# Patient Record
Sex: Female | Born: 1975 | Race: Black or African American | Hispanic: No | Marital: Married | Smoking: Never smoker
Health system: Southern US, Community
[De-identification: ages and names within clinical notes are randomized; demographics above are authoritative.]

## PROBLEM LIST (undated history)

## (undated) DIAGNOSIS — D219 Benign neoplasm of connective and other soft tissue, unspecified: Secondary | ICD-10-CM

## (undated) DIAGNOSIS — R350 Frequency of micturition: Secondary | ICD-10-CM

## (undated) HISTORY — PX: OTHER SURGICAL HISTORY: SHX169

---

## 1998-02-10 ENCOUNTER — Encounter: Admission: RE | Admit: 1998-02-10 | Discharge: 1998-05-11 | Payer: Self-pay | Admitting: *Deleted

## 1999-02-27 ENCOUNTER — Encounter: Admission: RE | Admit: 1999-02-27 | Discharge: 1999-03-23 | Payer: Self-pay

## 1999-03-27 ENCOUNTER — Other Ambulatory Visit: Admission: RE | Admit: 1999-03-27 | Discharge: 1999-03-27 | Payer: Self-pay | Admitting: Obstetrics & Gynecology

## 1999-11-15 ENCOUNTER — Inpatient Hospital Stay (HOSPITAL_COMMUNITY): Admission: AD | Admit: 1999-11-15 | Discharge: 1999-11-15 | Payer: Self-pay | Admitting: Obstetrics and Gynecology

## 1999-11-17 ENCOUNTER — Inpatient Hospital Stay (HOSPITAL_COMMUNITY): Admission: AD | Admit: 1999-11-17 | Discharge: 1999-11-19 | Payer: Self-pay | Admitting: Obstetrics and Gynecology

## 2000-03-28 ENCOUNTER — Encounter: Admission: RE | Admit: 2000-03-28 | Discharge: 2000-03-28 | Payer: Self-pay | Admitting: Family Medicine

## 2000-03-28 ENCOUNTER — Encounter: Payer: Self-pay | Admitting: Family Medicine

## 2000-06-20 ENCOUNTER — Other Ambulatory Visit: Admission: RE | Admit: 2000-06-20 | Discharge: 2000-06-20 | Payer: Self-pay | Admitting: Obstetrics and Gynecology

## 2000-08-01 ENCOUNTER — Encounter: Payer: Self-pay | Admitting: Orthopaedic Surgery

## 2000-08-01 ENCOUNTER — Ambulatory Visit (HOSPITAL_COMMUNITY): Admission: RE | Admit: 2000-08-01 | Discharge: 2000-08-01 | Payer: Self-pay | Admitting: Orthopaedic Surgery

## 2001-04-24 ENCOUNTER — Encounter: Admission: RE | Admit: 2001-04-24 | Discharge: 2001-04-24 | Payer: Self-pay | Admitting: Family Medicine

## 2001-04-24 ENCOUNTER — Encounter: Payer: Self-pay | Admitting: Family Medicine

## 2001-05-29 ENCOUNTER — Ambulatory Visit (HOSPITAL_COMMUNITY): Admission: RE | Admit: 2001-05-29 | Discharge: 2001-05-29 | Payer: Self-pay | Admitting: Family Medicine

## 2002-11-10 ENCOUNTER — Other Ambulatory Visit: Admission: RE | Admit: 2002-11-10 | Discharge: 2002-11-10 | Payer: Self-pay | Admitting: Obstetrics and Gynecology

## 2003-11-29 ENCOUNTER — Other Ambulatory Visit: Admission: RE | Admit: 2003-11-29 | Discharge: 2003-11-29 | Payer: Self-pay | Admitting: Obstetrics and Gynecology

## 2003-11-30 ENCOUNTER — Other Ambulatory Visit: Admission: RE | Admit: 2003-11-30 | Discharge: 2003-11-30 | Payer: Self-pay | Admitting: Obstetrics and Gynecology

## 2004-12-19 ENCOUNTER — Other Ambulatory Visit: Admission: RE | Admit: 2004-12-19 | Discharge: 2004-12-19 | Payer: Self-pay | Admitting: Obstetrics and Gynecology

## 2004-12-28 ENCOUNTER — Ambulatory Visit: Payer: Self-pay | Admitting: Internal Medicine

## 2005-01-04 ENCOUNTER — Ambulatory Visit: Payer: Self-pay

## 2005-01-12 ENCOUNTER — Ambulatory Visit: Payer: Self-pay

## 2008-08-08 ENCOUNTER — Inpatient Hospital Stay (HOSPITAL_COMMUNITY): Admission: AD | Admit: 2008-08-08 | Discharge: 2008-08-10 | Payer: Self-pay | Admitting: Obstetrics and Gynecology

## 2009-04-09 ENCOUNTER — Encounter
Admission: RE | Admit: 2009-04-09 | Discharge: 2009-04-09 | Payer: Self-pay | Admitting: Physical Medicine and Rehabilitation

## 2011-08-13 LAB — CBC
HCT: 23.3 — ABNORMAL LOW
HCT: 27.8 — ABNORMAL LOW
Hemoglobin: 7.7 — CL
Hemoglobin: 9.2 — ABNORMAL LOW
MCHC: 33.1
MCHC: 33.2
MCV: 81.5
MCV: 82.2
Platelets: 208
Platelets: 254
RBC: 2.83 — ABNORMAL LOW
RBC: 3.41 — ABNORMAL LOW
RDW: 16 — ABNORMAL HIGH
RDW: 16.3 — ABNORMAL HIGH
WBC: 10.1
WBC: 14.4 — ABNORMAL HIGH

## 2011-08-13 LAB — RPR: RPR Ser Ql: NONREACTIVE

## 2015-10-11 ENCOUNTER — Other Ambulatory Visit: Payer: Self-pay | Admitting: Obstetrics and Gynecology

## 2015-10-11 DIAGNOSIS — N6320 Unspecified lump in the left breast, unspecified quadrant: Secondary | ICD-10-CM

## 2015-10-14 ENCOUNTER — Ambulatory Visit
Admission: RE | Admit: 2015-10-14 | Discharge: 2015-10-14 | Disposition: A | Discharge status: Home / Self Care | Payer: 59 | Source: Ambulatory Visit | Attending: Obstetrics and Gynecology | Admitting: Obstetrics and Gynecology

## 2015-10-14 DIAGNOSIS — N6320 Unspecified lump in the left breast, unspecified quadrant: Secondary | ICD-10-CM

## 2017-06-26 ENCOUNTER — Other Ambulatory Visit: Payer: Self-pay | Admitting: Obstetrics and Gynecology

## 2017-06-26 DIAGNOSIS — N63 Unspecified lump in unspecified breast: Secondary | ICD-10-CM

## 2017-07-10 ENCOUNTER — Ambulatory Visit
Admission: RE | Admit: 2017-07-10 | Discharge: 2017-07-10 | Disposition: A | Discharge status: Home / Self Care | Payer: 59 | Source: Ambulatory Visit | Attending: Obstetrics and Gynecology | Admitting: Obstetrics and Gynecology

## 2017-07-10 DIAGNOSIS — N63 Unspecified lump in unspecified breast: Secondary | ICD-10-CM

## 2020-01-13 ENCOUNTER — Other Ambulatory Visit: Payer: Self-pay | Admitting: Obstetrics and Gynecology

## 2020-01-13 DIAGNOSIS — D259 Leiomyoma of uterus, unspecified: Secondary | ICD-10-CM

## 2020-02-20 ENCOUNTER — Other Ambulatory Visit: Payer: 59

## 2020-02-22 ENCOUNTER — Ambulatory Visit
Admission: RE | Admit: 2020-02-22 | Discharge: 2020-02-22 | Disposition: A | Discharge status: Home / Self Care | Payer: No Typology Code available for payment source | Source: Ambulatory Visit | Attending: Obstetrics and Gynecology | Admitting: Obstetrics and Gynecology

## 2020-02-22 ENCOUNTER — Other Ambulatory Visit: Payer: Self-pay

## 2020-02-22 DIAGNOSIS — D259 Leiomyoma of uterus, unspecified: Secondary | ICD-10-CM

## 2020-11-29 IMAGING — MR MR ABDOMEN W/O CM
7 series · 48 of 48 positions shown · non-contrast
Comparison: None

CLINICAL DATA: Evaluate adrenal glands.

EXAM:
MRI ABDOMEN WITHOUT CONTRAST
TECHNIQUE: Multiplanar multisequence MR imaging was performed without the
administration of intravenous contrast.

[Series 3: T2 · coronal · 5.0mm · 1.56mm/px · 2 of 36 slices shown (1 of 2)]
[im 1/36]
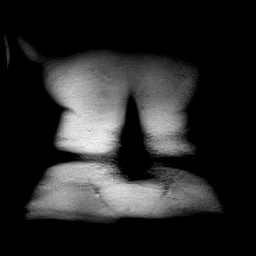
[im 36/36]
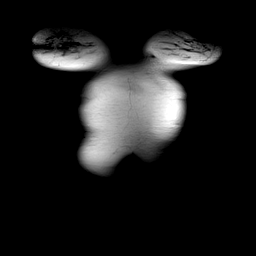

[Series 4: T2 · axial · 6.0mm · 1.19mm/px · z∈[-46,+163]mm · 3 of 30 slices shown (2 of 2)]
[im 1/30]
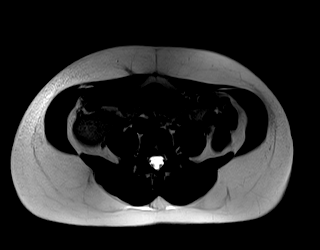
[im 15/30]
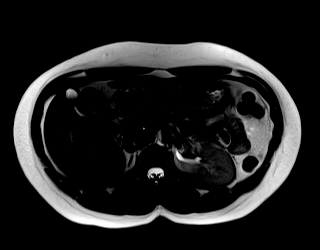
[im 30/30]
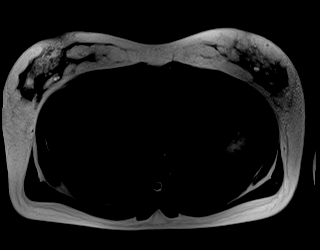

[Series 5: T1 · axial · 3.0mm · 1.48mm/px · z∈[-48,+165]mm · 14 of 144 slices shown]
[im 1/144]
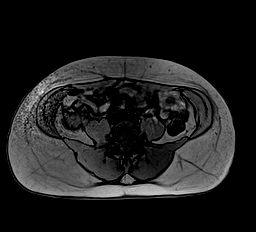
[im 12/144]
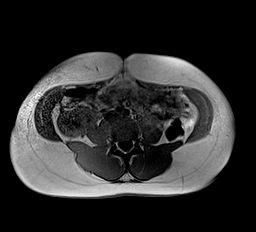
[im 23/144]
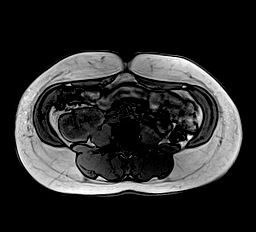
[im 34/144]
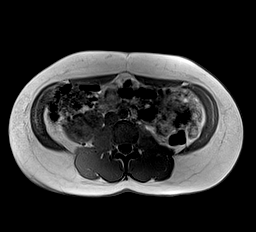
[im 45/144]
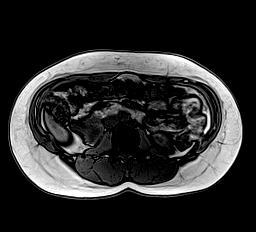
[im 56/144]
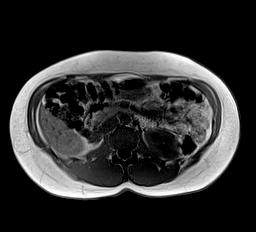
[im 67/144]
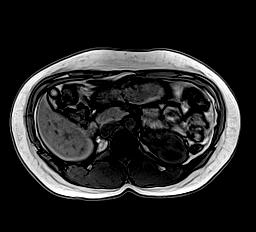
[im 78/144]
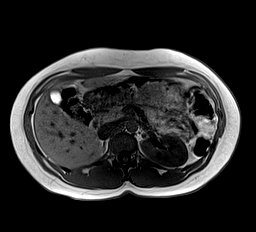
[im 89/144]
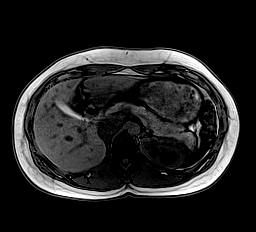
[im 100/144]
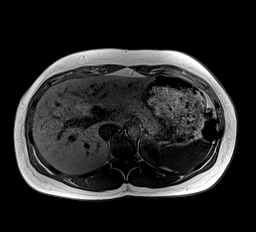
[im 111/144]
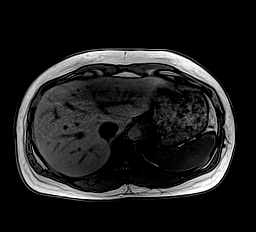
[im 122/144]
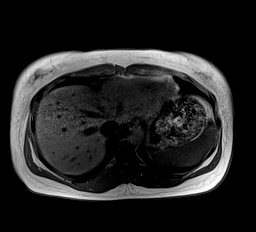
[im 133/144]
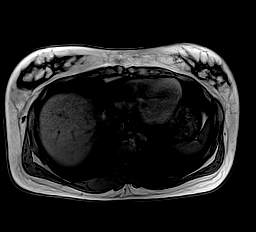
[im 144/144]
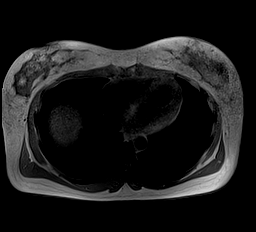

[Series 6: cor inout phase · coronal · 3.0mm · 1.48mm/px · 14 of 144 slices shown]
[im 1/144]
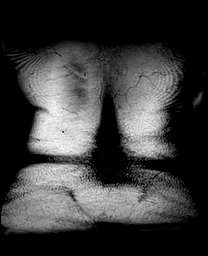
[im 12/144]
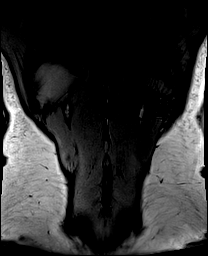
[im 23/144]
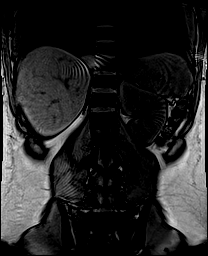
[im 34/144]
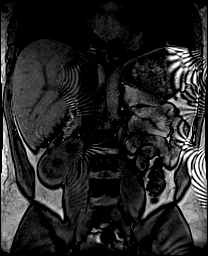
[im 45/144]
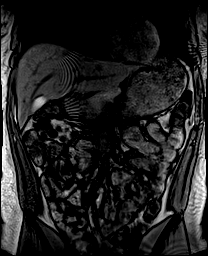
[im 56/144]
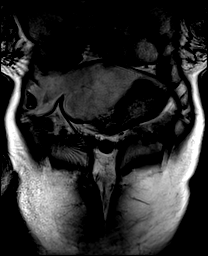
[im 67/144]
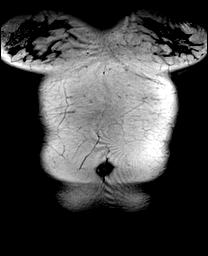
[im 78/144]
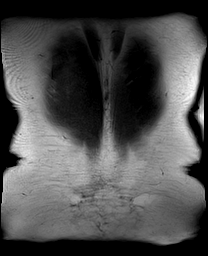
[im 89/144]
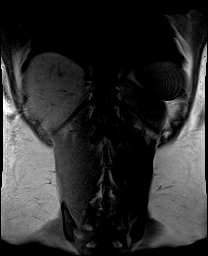
[im 100/144]
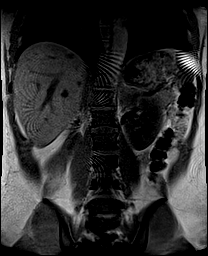
[im 111/144]
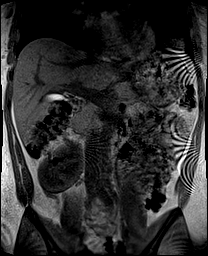
[im 122/144]
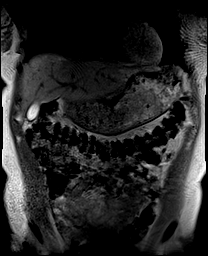
[im 133/144]
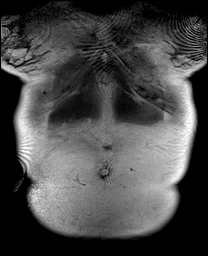
[im 144/144]
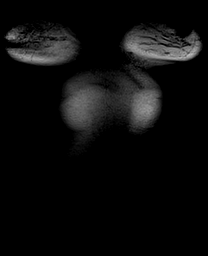

[Series 7: bSSFP · axial · 5.0mm · 1.56mm/px · z∈[-34,+151]mm · 4 of 38 slices shown (1 of 2)]
[im 1/38]
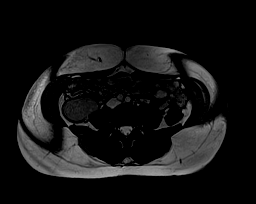
[im 13/38]
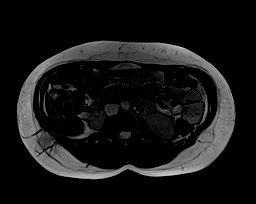
[im 25/38]
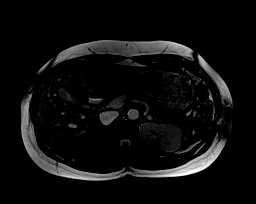
[im 38/38]
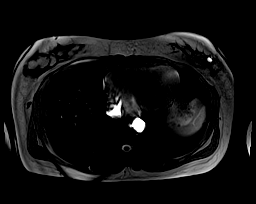

[Series 8: bSSFP · coronal · 5.0mm · 1.56mm/px · 4 of 38 slices shown (2 of 2)]
[im 1/38]
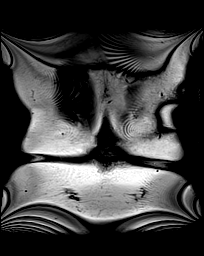
[im 13/38]
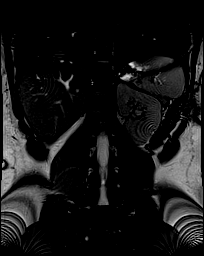
[im 25/38]
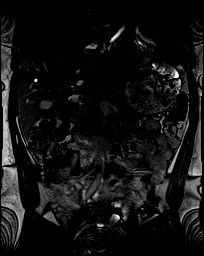
[im 38/38]
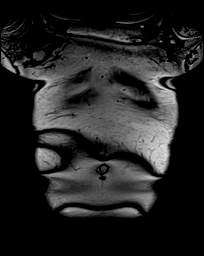

[Series 9: T1 dynamic · axial · 3.0mm · 1.56mm/px · z∈[-48,+165]mm · 7 of 72 slices shown]
[im 1/72]
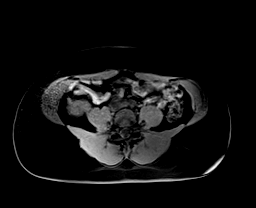
[im 12/72]
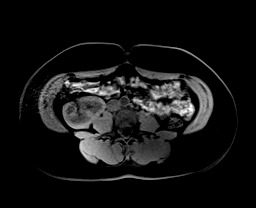
[im 24/72]
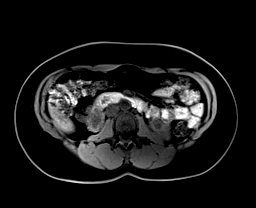
[im 36/72]
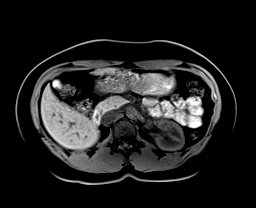
[im 48/72]
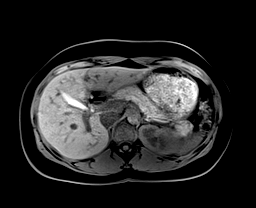
[im 60/72]
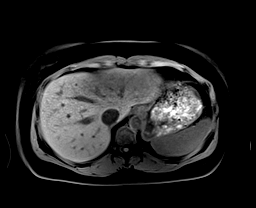
[im 72/72]
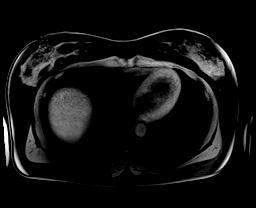

[48 of 48 positions shown; findings below may reference images not displayed]

FINDINGS: Lower chest: No acute findings.

Hepatobiliary: Within the subcapsular aspect of the lateral segment
of left lobe of liver there is a 8 mm T2 hyperintense and T1
hypointense structure. Technically too small to reliably
characterize but favored to represent a small cyst. No additional
focal liver abnormalities. Gallbladder is unremarkable. No biliary
ductal dilatation identified.

Pancreas: No mass, inflammatory changes, or other parenchymal
abnormality identified.

Spleen:  Within normal limits in size and appearance.

Adrenals/Urinary Tract: Normal adrenal glands. No nodule or mass
identified bilaterally. Left kidney normal. Small cystic lesion
within upper pole of right kidney measures 4 mm and is too small to
reliably characterize. No solid appearing mass identified within the
limitations of unenhanced technique. No hydronephrosis bilaterally.

Stomach/Bowel: Visualized portions within the abdomen are
unremarkable.

Vascular/Lymphatic: No pathologically enlarged lymph nodes
identified. No abdominal aortic aneurysm demonstrated.

Other:  None.

Musculoskeletal: No suspicious bone lesions identified.
IMPRESSION: 1. No adrenal nodule or mass identified .
2. Small right lobe of liver lesion is too small to reliably
characterize but favored to represent a small cyst.

## 2021-06-08 NOTE — Progress Notes (Signed)
Called pt to make lab appt and covid test appt to her surgery (robotic total hysterectomy) on 07-05-2021 by Dr Brien Mates. Pt stated she is out of town and would not be returning until 07-04-2021 day before surgery , flight scheduled to land 1615. Pt told she will need to arrive at [facility] 07-05-2021 pull up front the building and call [facility] front desk that she has arrive and nurse will come out to do rapid covid test then she receive call if test is negative she can come to check-in but  if test positive her procedure would be cancelled.  Pt verbalized understanding of this and that a nurse will be calling her closer to [facility] to completed pre-op interview and instructions , pt gave best contact # [phone]. Called and left message with OR scheduler for Dr Brien Mates to please inform doctor about this and any questions please call back.

## 2021-06-28 ENCOUNTER — Encounter (HOSPITAL_BASED_OUTPATIENT_CLINIC_OR_DEPARTMENT_OTHER): Payer: Self-pay | Admitting: Obstetrics and Gynecology

## 2021-06-28 ENCOUNTER — Other Ambulatory Visit: Payer: Self-pay

## 2021-06-28 DIAGNOSIS — Z973 Presence of spectacles and contact lenses: Secondary | ICD-10-CM

## 2021-06-28 DIAGNOSIS — R351 Nocturia: Secondary | ICD-10-CM

## 2021-06-28 DIAGNOSIS — D649 Anemia, unspecified: Secondary | ICD-10-CM

## 2021-06-28 DIAGNOSIS — G8929 Other chronic pain: Secondary | ICD-10-CM

## 2021-06-28 DIAGNOSIS — R519 Headache, unspecified: Secondary | ICD-10-CM

## 2021-06-28 DIAGNOSIS — I10 Essential (primary) hypertension: Secondary | ICD-10-CM

## 2021-06-28 DIAGNOSIS — M549 Dorsalgia, unspecified: Secondary | ICD-10-CM

## 2021-06-28 HISTORY — DX: Presence of spectacles and contact lenses: Z97.3

## 2021-06-28 HISTORY — DX: Essential (primary) hypertension: I10

## 2021-06-28 HISTORY — DX: Other chronic pain: G89.29

## 2021-06-28 HISTORY — DX: Anemia, unspecified: D64.9

## 2021-06-28 HISTORY — DX: Dorsalgia, unspecified: M54.9

## 2021-06-28 HISTORY — DX: Headache, unspecified: R51.9

## 2021-06-28 HISTORY — DX: Nocturia: R35.1

## 2021-06-28 NOTE — Progress Notes (Addendum)
Your procedure is scheduled on 07-05-2021   Report to [facility] FOR YOUR RAPID COVID TEST PULL UP TO THE FRONT OF THE BUILDING AND CALL [phone] AND TELL THE FRONT DESK YOU HAVE ARRIVE AND REMAIN IN YOUR CAR FOR THE COVID TEST AND YOU WILL BE CALLED TO COME IN FOR YOUR SURGERY IF THE COVID TEST IS NEGATIVE.IF THE COVID TEST IS POSITIVE YOUR SURGERY WILL NEED TO BE CANCELLED.   Call this number if you have problems the morning of surgery  :[phone].   OUR ADDRESS IS [place].  WE ARE LOCATED IN [facility].  PLEASE BRING YOUR INSURANCE CARD AND PHOTO ID DAY OF SURGERY.  ONLY ONE PERSON ALLOWED IN FACILITY WAITING AREA.                                     REMEMBER:  DO NOT EAT FOOD, CANDY GUM OR MINTS  AFTER MIDNIGHT . YOU MAY HAVE CLEAR LIQUIDS FROM MIDNIGHT UNTIL 1000 AM. NO CLEAR LIQUIDS AFTER 1000 AM DAY OF SURGERY.   YOU MAY  BRUSH YOUR TEETH MORNING OF SURGERY AND RINSE YOUR MOUTH OUT, NO CHEWING GUM CANDY OR MINTS.    CLEAR LIQUID DIET   Foods Allowed                                                                     Foods Excluded  Coffee and tea, regular and decaf                             liquids that you cannot  Plain Jell-O any favor except red or purple                                           see through such as: Fruit ices (not with fruit pulp)                                     milk, soups, orange juice  Iced Popsicles                                    All solid food Carbonated beverages, regular and diet                                    Cranberry, grape and apple juices Sports drinks like Gatorade Lightly seasoned clear broth or consume(fat free) Sugar, honey syrup  Sample Menu Breakfast                                Lunch  Supper Cranberry juice                    Beef broth                            Chicken broth Jell-O                                      Grape juice                           Apple juice Coffee or tea                        Jell-O                                      Popsicle                                                Coffee or tea                        Coffee or tea  _____________________________________________________________________     TAKE THESE MEDICATIONS MORNING OF SURGERY WITH A SIP OF WATER:  AMLODIPINE  ONE VISITOR IS ALLOWED IN WAITING ROOM ONLY DAY OF SURGERY.  NO VISITOR MAY SPEND THE NIGHT.  VISITOR ARE ALLOWED TO STAY UNTIL 800 PM.                                    DO NOT WEAR JEWERLY, MAKE UP. DO NOT WEAR LOTIONS, POWDERS, PERFUMES OR NAIL POLISH. DO NOT SHAVE FOR 48 HOURS PRIOR TO DAY OF SURGERY. MEN MAY SHAVE FACE AND NECK. CONTACTS, GLASSES, OR DENTURES MAY NOT BE WORN TO SURGERY.                                    [name] IS NOT RESPONSIBLE  FOR ANY BELONGINGS.                                                                    [name]           [name] - Preparing for Surgery (BUY THE SURGICAL SOAP AT [facility] A DRUGSTORE IT MAY BE ANY BRAND SUCH AS HIBICLENS, CHLOROHEXADINE, OR ANY PHARMACY STAFF FOR ANY SIMILAR SURGICAL SOAP)   Before surgery, you can play an important role.  Because skin is not sterile, your skin needs to be as free of germs as possible.  You can reduce the number of germs on your skin by washing with CHG (chlorahexidine gluconate) soap before surgery.  CHG is an antiseptic cleaner which kills germs and bonds with  the skin to continue killing germs even after washing. Please DO NOT use if you have an allergy to CHG or antibacterial soaps.  If your skin becomes reddened/irritated stop using the CHG and inform your nurse when you arrive at Short Stay. Do not shave (including legs and underarms) for at least 48 hours prior to the first CHG shower.  You may shave your face/neck. Please follow these instructions carefully:  1.  Shower with CHG Soap the night before  surgery and the  morning of Surgery.  2.  If you choose to wash your hair, wash your hair first as usual with your  normal  shampoo.  3.  After you shampoo, rinse your hair and body thoroughly to remove the  shampoo.                            4.  Use CHG as you would any other liquid soap.  You can apply chg directly  to the skin and wash                      Gently with a scrungie or clean washcloth.  5.  Apply the CHG Soap to your body ONLY FROM THE NECK DOWN.   Do not use on face/ open                           Wound or open sores. Avoid contact with eyes, ears mouth and genitals (private parts).                       Wash face,  Genitals (private parts) with your normal soap.             6.  Wash thoroughly, paying special attention to the area where your surgery  will be performed.  7.  Thoroughly rinse your body with warm water from the neck down.  8.  DO NOT shower/wash with your normal soap after using and rinsing off  the CHG Soap.                9.  Pat yourself dry with a clean towel.            10.  Wear clean pajamas.            11.  Place clean sheets on your bed the night of your first shower and do not  sleep with pets. Day of Surgery : Do not apply any lotions/deodorants the morning of surgery.  Please wear clean clothes to the hospital/surgery center.  FAILURE TO FOLLOW THESE INSTRUCTIONS MAY RESULT IN THE CANCELLATION OF YOUR SURGERY PATIENT SIGNATURE_________________________________  NURSE SIGNATURE__________________________________  ________________________________________________________________________                                                        QUESTIONS Dawn Pitts PRE OP NURSE PHONE [phone].

## 2021-06-28 NOTE — Progress Notes (Signed)
Spoke w/ via phone for pre-op interview---pt Lab needs dos----   urine poct bmet, ekg per anesthesia, surgery orders pending (need cbc and t &s ) COVID test ----rapid covid test dos due to out of town until 07-04-2021 Arrive at -------745 am NPO after MN NO Solid Food.  Clear liquids from MN until---645 am Med rec completed Medications to take morning of surgery -----amlodipine Diabetic medication -----n/a Patient instructed no nail polish to be worn day of surgery Patient instructed to bring photo id and insurance card day of surgery Patient aware to have Driver (ride ) / caregiver  spouse Dawn Pitts   for 24 hours after surgery  Patient Special Instructions -----pt given overnight stay instructions Pre-Op special Istructions -----surg order req dr Brien Mates epcic ib Patient verbalized understanding of instructions that were given at this phone interview. Patient denies shortness of breath, chest pain, fever, cough at this phone interview.

## 2021-06-29 NOTE — Progress Notes (Signed)
Your procedure is scheduled on 07-05-2021   Report to [facility] FOR YOUR RAPID COVID TEST PULL UP TO THE FRONT OF THE BUILDING AND CALL [phone] AND TELL THE FRONT DESK YOU HAVE ARRIVE AND REMAIN IN YOUR CAR FOR THE COVID TEST AND YOU WILL BE CALLED TO COME IN FOR YOUR SURGERY IF THE COVID TEST IS NEGATIVE.IF THE COVID TEST IS POSITIVE YOUR SURGERY WILL NEED TO BE CANCELLED.   Call this number if you have problems the morning of surgery  :[phone].   OUR ADDRESS IS [place].  WE [facility]ATED [facility].  PLEASE BRING YOUR INSURANCE CARD AND PHOTO ID DAY OF SURGERY.  ONLY ONE PERSON ALLOWED IN FACILITY WAITING AREA.                                     REMEMBER:  DO NOT EAT FOOD, CANDY GUM OR MINTS  AFTER MIDNIGHT . YOU MAY HAVE CLEAR LIQUIDS FROM MIDNIGHT UNTIL 1000 AM. NO CLEAR LIQUIDS AFTER 1000 AM DAY OF SURGERY.   YOU MAY  BRUSH YOUR TEETH MORNING OF SURGERY AND RINSE YOUR MOUTH OUT, NO CHEWING GUM CANDY OR MINTS.    CLEAR LIQUID DIET   Foods Allowed                                                                     Foods Excluded  Coffee and tea, regular and decaf                             liquids that you cannot  Plain Jell-O any favor except red or purple                                           see through such as: Fruit ices (not with fruit pulp)                                     milk, soups, orange juice  Iced Popsicles                                    All solid food Carbonated beverages, regular and diet                                    Cranberry, grape and apple juices Sports drinks like Gatorade Lightly seasoned clear broth or consume(fat free) Sugar, honey syrup  Sample Menu Breakfast                                Lunch  Supper Cranberry juice                    Beef broth                            Chicken broth Jell-O                                      Grape juice                           Apple juice Coffee or tea                        Jell-O                                      Popsicle                                                Coffee or tea                        Coffee or tea  _____________________________________________________________________     TAKE THESE MEDICATIONS MORNING OF SURGERY WITH A SIP OF WATER:  AMLODIPINE  ONE VISITOR IS ALLOWED IN WAITING ROOM ONLY DAY OF SURGERY.  NO VISITOR MAY SPEND THE NIGHT.  VISITOR ARE ALLOWED TO STAY UNTIL 800 PM.                                    DO NOT WEAR JEWERLY, MAKE UP. DO NOT WEAR LOTIONS, POWDERS, PERFUMES OR NAIL POLISH. DO NOT SHAVE FOR 48 HOURS PRIOR TO DAY OF SURGERY. MEN MAY SHAVE FACE AND NECK. CONTACTS, GLASSES, OR DENTURES MAY NOT BE WORN TO SURGERY.                                    [facility] IS NOT RESPONSIBLE  FOR ANY BELONGINGS.                                                                    [name]           [place] - Preparing for Surgery (BUY THE SURGICAL SOAP AT [facility] DRUG[facility]ORE IT MAY BE ANY BRAND SUCH AS HIBICLENS, CHLOROHEXADINE, OR ANY PHARMACY STAFF FOR ANY SIMILAR SURGICAL SOAP)   Before surgery, you can play an important role.  Because skin is not sterile, your skin needs to be as free of germs as possible.  You can reduce the number of germs on your skin by washing with CHG (chlorahexidine gluconate) soap before surgery.  CHG is an antiseptic cleaner which kills germs and bonds with  the skin to continue killing germs even after washing. Please DO NOT use if you have an allergy to CHG or antibacterial soaps.  If your skin becomes reddened/irritated stop using the CHG and inform your nurse when you arrive at Short Stay. Do not shave (including legs and underarms) for at least 48 hours prior to the first CHG shower.  You may shave your face/neck. Please follow these instructions carefully:  1.  Shower with CHG Soap the night before  surgery and the  morning of Surgery.  2.  If you choose to wash your hair, wash your hair first as usual with your  normal  shampoo.  3.  After you shampoo, rinse your hair and body thoroughly to remove the  shampoo.                            4.  Use CHG as you would any other liquid soap.  You can apply chg directly  to the skin and wash                      Gently with a scrungie or clean washcloth.  5.  Apply the CHG Soap to your body ONLY FROM THE NECK DOWN.   Do not use on face/ open                           Wound or open sores. Avoid contact with eyes, ears mouth and genitals (private parts).                       Wash face,  Genitals (private parts) with your normal soap.             6.  Wash thoroughly, paying special attention to the area where your surgery  will be performed.  7.  Thoroughly rinse your body with warm water from the neck down.  8.  DO NOT shower/wash with your normal soap after using and rinsing off  the CHG Soap.                9.  Pat yourself dry with a clean towel.            10.  Wear clean pajamas.            11.  Place clean sheets on your bed the night of your first shower and do not  sleep with pets. Day of Surgery : Do not apply any lotions/deodorants the morning of surgery.  Please wear clean clothes to the hospital/surgery center.  FAILURE TO FOLLOW THESE INSTRUCTIONS MAY RESULT IN THE CANCELLATION OF YOUR SURGERY PATIENT SIGNATURE_________________________________  NURSE SIGNATURE__________________________________  ________________________________________________________________________                                                        QUESTIONS Dawn Pitts PRE OP NURSE PHONE [phone].

## 2021-07-04 NOTE — H&P (Signed)
Dawn Pitts is an 45 y.o. female P4 with symptomatic multifibroid uterus, failed medical management, desires hysterectomy for definitive management.   Previously patient of Dr. Ouida Sills, was in medication trial previously with progesterone receptor modulator, helped very little with symptoms. Had consult for hysterectomy with Dr. Ouida Sills last year, was unable to move forward due to lack of coverage at work. States she is ready to move forward now and symptoms have become worse.   "Heavy 5 day periods for past several years, soaks through maxi pad on days 1-3. Now also spotting in between periods.  Also bothered by constant pelvic and rectal pressure. Pressure getting worse.   States BP normal at PCP last month.   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  Allergies: none Meds: Phentermine Surgeries: right knee and achilles surgeries OB h/o SVD x 4"  04/25/21 [place]: retroverted uterus 8.45cm, multiple fibroids intramural and submucosal (2 largest at right uterine fundus, 4cm and 2.5cm), simple cyst on right ovary, normal left ovary   Since last visit, patient diagnosed with hypertension by PCP and started on amlodipine.   Patient's last menstrual period was 06/22/2021.    Past Medical History:  Diagnosis Date   Anemia 06/28/2021   Chronic back pain 06/28/2021   Fibroid    Headache 06/28/2021   Hypertension 06/28/2021   Nocturia 06/28/2021   Urinary frequency    Wears glasses 06/28/2021    Past Surgical History:  Procedure Laterality Date   right achilles tendon repair     7 yrs ago per pt on 06-28-2021   right acl repair     1993    History reviewed. No pertinent family history.  Social History:  reports that she has never smoked. She has never used smokeless tobacco. She reports current alcohol use. She reports that she does not use drugs.  Allergies: No Known Allergies  Medications Prior to Admission  Medication Sig Dispense Refill Last Dose   AMLODIPINE  BENZOATE PO Take 10 mg by mouth in the morning.   07/05/2021 at 0645   Ferrous Sulfate (IRON) 325 (65 Fe) MG TABS Take by mouth daily.   07/03/2021   MELOXICAM PO Take by mouth daily.   06/30/2021    Review of Systems  Constitutional:  Negative for fever.  HENT:  Negative for sore throat.   Eyes:  Negative for pain.  Respiratory:  Negative for shortness of breath.   Cardiovascular:  Negative for chest pain.  Gastrointestinal:  Negative for abdominal pain.  Genitourinary:  Negative for dysuria.  Musculoskeletal:  Negative for joint swelling.  Skin:  Negative for rash.  Neurological:  Negative for headaches.  Psychiatric/Behavioral:  Negative for suicidal ideas.    Blood pressure (!) 177/106, pulse 81, temperature 98.4 F (36.9 C), temperature source Oral, resp. rate 16, height '5\' 4"'$  (1.626 m), weight 78.6 kg, last menstrual period 06/22/2021, SpO2 100 %. Physical Exam  Chaperone: present    Abdomen Auscultation/Inspection/Palpation: normal bowel sounds, soft, non-distended, no tenderness, no hepatomegaly, no splenomegaly, no masses, no CVA tenderness Hernia: none palpated  Female Genitalia Vulva: no masses, no atrophy, no lesions Bladder/Urethra: normal meatus, no urethral discharge, no urethral mass, bladder non distended Vagina no tenderness, no erythema, no abnormal vaginal discharge, no vesicle(s) or ulcers, no cystocele, no rectocele Cervix: grossly normal, no discharge, no cervical motion tenderness Uterus: normal size, mobile, non-tender, no uterine prolapse, contour irregular, fibroids, retroverted Adnexa/Parametria: no parametrial tenderness, no parametrial mass, no adnexal tenderness, no ovarian mass  CVS: RRR lungs: CTAB  Results for orders placed or performed during the hospital encounter of 07/05/21 (from the past 24 hour(s))  Resp Panel by RT-PCR (Flu A&B, Covid) Nasopharyngeal Swab     Status: None   Collection Time: 07/05/21  7:58 AM   Specimen: Nasopharyngeal  Swab; Nasopharyngeal(NP) swabs in vial transport medium  Result Value Ref Range   SARS Coronavirus 2 by RT PCR NEGATIVE NEGATIVE   Influenza A by PCR NEGATIVE NEGATIVE   Influenza B by PCR NEGATIVE NEGATIVE  Pregnancy, urine POC     Status: None   Collection Time: 07/05/21  9:11 AM  Result Value Ref Range   Preg Test, Ur NEGATIVE NEGATIVE  Type and screen [facility]     Status: None (Preliminary result)   Collection Time: 07/05/21  9:57 AM  Result Value Ref Range   ABO/RH(D) PENDING    Antibody Screen PENDING    Sample Expiration      07/08/2021,2359 Performed at [facility] [address] [place], [place] [address]     No results found.  Assessment/Plan: 45Y P4 with multifibroid uterus,menorrhagia, failed medical management, here for exam under anesthesia, robot assisted total laparoscopic hysterectomy, bilateral salpingectomy, cystoscopy. - Risks/benefits of surgery reviewed. Discussed risk of infection, bleeding, damage to surrounding organs, laparotomy, blood transfusion. Patient states understanding and signed consent form.  - NPO - Ancef 2g - Foley   Rowland Lathe 07/05/2021, 10:40 AM

## 2021-07-05 ENCOUNTER — Ambulatory Visit (HOSPITAL_BASED_OUTPATIENT_CLINIC_OR_DEPARTMENT_OTHER): Payer: No Typology Code available for payment source | Admitting: Anesthesiology

## 2021-07-05 ENCOUNTER — Ambulatory Visit (HOSPITAL_BASED_OUTPATIENT_CLINIC_OR_DEPARTMENT_OTHER)
Admission: RE | Admit: 2021-07-05 | Discharge: 2021-07-05 | Disposition: A | Discharge status: Home / Self Care | Payer: No Typology Code available for payment source | Attending: Obstetrics and Gynecology | Admitting: Obstetrics and Gynecology

## 2021-07-05 ENCOUNTER — Other Ambulatory Visit: Payer: Self-pay

## 2021-07-05 ENCOUNTER — Encounter (HOSPITAL_BASED_OUTPATIENT_CLINIC_OR_DEPARTMENT_OTHER)
Admission: RE | Disposition: A | Discharge status: Home / Self Care | Payer: Self-pay | Source: Home / Self Care | Attending: Obstetrics and Gynecology

## 2021-07-05 ENCOUNTER — Encounter (HOSPITAL_BASED_OUTPATIENT_CLINIC_OR_DEPARTMENT_OTHER): Payer: Self-pay | Admitting: Obstetrics and Gynecology

## 2021-07-05 DIAGNOSIS — N800 Endometriosis of uterus: Secondary | ICD-10-CM | POA: Insufficient documentation

## 2021-07-05 DIAGNOSIS — Z791 Long term (current) use of non-steroidal anti-inflammatories (NSAID): Secondary | ICD-10-CM | POA: Diagnosis not present

## 2021-07-05 DIAGNOSIS — R102 Pelvic and perineal pain: Secondary | ICD-10-CM | POA: Diagnosis not present

## 2021-07-05 DIAGNOSIS — Z90710 Acquired absence of both cervix and uterus: Secondary | ICD-10-CM | POA: Diagnosis present

## 2021-07-05 DIAGNOSIS — Z20822 Contact with and (suspected) exposure to covid-19: Secondary | ICD-10-CM | POA: Diagnosis not present

## 2021-07-05 DIAGNOSIS — I10 Essential (primary) hypertension: Secondary | ICD-10-CM | POA: Insufficient documentation

## 2021-07-05 DIAGNOSIS — N920 Excessive and frequent menstruation with regular cycle: Secondary | ICD-10-CM | POA: Insufficient documentation

## 2021-07-05 DIAGNOSIS — Z79899 Other long term (current) drug therapy: Secondary | ICD-10-CM | POA: Insufficient documentation

## 2021-07-05 DIAGNOSIS — D259 Leiomyoma of uterus, unspecified: Secondary | ICD-10-CM | POA: Diagnosis not present

## 2021-07-05 HISTORY — DX: Frequency of micturition: R35.0

## 2021-07-05 HISTORY — PX: ROBOTIC ASSISTED TOTAL HYSTERECTOMY WITH BILATERAL SALPINGO OOPHERECTOMY: SHX6086

## 2021-07-05 HISTORY — PX: CYSTOSCOPY: SHX5120

## 2021-07-05 HISTORY — DX: Benign neoplasm of connective and other soft tissue, unspecified: D21.9

## 2021-07-05 LAB — BASIC METABOLIC PANEL
Anion gap: 9 (ref 5–15)
BUN: 12 mg/dL (ref 6–20)
CO2: 24 mmol/L (ref 22–32)
Calcium: 9.4 mg/dL (ref 8.9–10.3)
Chloride: 107 mmol/L (ref 98–111)
Creatinine, Ser: 0.8 mg/dL (ref 0.44–1.00)
GFR, Estimated: >60 mL/min (ref >60)
Glucose, Bld: 97 mg/dL (ref 70–99)
Potassium: 3.7 mmol/L (ref 3.5–5.1)
Sodium: 140 mmol/L (ref 135–145)

## 2021-07-05 LAB — CBC WITH DIFFERENTIAL/PLATELET
Abs Immature Granulocytes: 0.01 10*3/uL (ref 0.00–0.07)
Basophils Absolute: 0 10*3/uL (ref 0.0–0.1)
Basophils Relative: 1 %
Eosinophils Absolute: 0.1 10*3/uL (ref 0.0–0.5)
Eosinophils Relative: 2 %
HCT: 36.9 % (ref 36.0–46.0)
Hemoglobin: 11 g/dL — ABNORMAL LOW (ref 12.0–15.0)
Immature Granulocytes: 0 %
Lymphocytes Relative: 32 %
Lymphs Abs: 1.7 10*3/uL (ref 0.7–4.0)
MCH: 22.7 pg — ABNORMAL LOW (ref 26.0–34.0)
MCHC: 29.8 g/dL — ABNORMAL LOW (ref 30.0–36.0)
MCV: 76.1 fL — ABNORMAL LOW (ref 80.0–100.0)
Monocytes Absolute: 0.4 10*3/uL (ref 0.1–1.0)
Monocytes Relative: 8 %
Neutro Abs: 3.1 10*3/uL (ref 1.7–7.7)
Neutrophils Relative %: 57 %
Platelets: 364 10*3/uL (ref 150–400)
RBC: 4.85 MIL/uL (ref 3.87–5.11)
RDW: 26 % — ABNORMAL HIGH (ref 11.5–15.5)
WBC: 5.4 10*3/uL (ref 4.0–10.5)
nRBC: 0 % (ref 0.0–0.2)

## 2021-07-05 LAB — RESP PANEL BY RT-PCR (FLU A&B, COVID) ARPGX2
Influenza A by PCR: NEGATIVE
Influenza B by PCR: NEGATIVE
SARS Coronavirus 2 by RT PCR: NEGATIVE

## 2021-07-05 LAB — TYPE AND SCREEN
ABO/RH(D): B POS
Antibody Screen: NEGATIVE

## 2021-07-05 LAB — POCT PREGNANCY, URINE: Preg Test, Ur: NEGATIVE

## 2021-07-05 LAB — ABO/RH: ABO/RH(D): B POS

## 2021-07-05 SURGERY — HYSTERECTOMY, TOTAL, ROBOT-ASSISTED, LAPAROSCOPIC, WITH BILATERAL SALPINGO-OOPHORECTOMY
Anesthesia: General | Site: Bladder

## 2021-07-05 MED ORDER — OXYCODONE HCL 5 MG PO TABS: 5.0000 mg | ORAL_TABLET | Freq: Four times a day (QID) | ORAL | 0 refills | Status: AC | PRN

## 2021-07-05 MED ORDER — ONDANSETRON HCL 4 MG/2ML IJ SOLN: 4.0000 mg | Freq: Four times a day (QID) | INTRAMUSCULAR | Status: DC | PRN

## 2021-07-05 MED ORDER — ONDANSETRON HCL 4 MG/2ML IJ SOLN
INTRAMUSCULAR | Status: AC
  Filled 2021-07-05: qty 2

## 2021-07-05 MED ORDER — POVIDONE-IODINE 10 % EX SWAB
2.0000 "application " | Freq: Once | CUTANEOUS | Status: AC
  Administered 2021-07-05: 2 via TOPICAL

## 2021-07-05 MED ORDER — LIDOCAINE HCL (PF) 2 % IJ SOLN
INTRAMUSCULAR | Status: AC
  Filled 2021-07-05: qty 20

## 2021-07-05 MED ORDER — GLYCOPYRROLATE PF 0.2 MG/ML IJ SOSY
PREFILLED_SYRINGE | INTRAMUSCULAR | Status: AC
  Filled 2021-07-05: qty 1

## 2021-07-05 MED ORDER — ACETAMINOPHEN 500 MG PO TABS
1000.0000 mg | ORAL_TABLET | Freq: Four times a day (QID) | ORAL | Status: DC
  Administered 2021-07-05: 1000 mg via ORAL

## 2021-07-05 MED ORDER — LIDOCAINE 2% (20 MG/ML) 5 ML SYRINGE
INTRAMUSCULAR | Status: DC | PRN
  Administered 2021-07-05: 80 mg via INTRAVENOUS

## 2021-07-05 MED ORDER — OXYCODONE HCL 5 MG PO TABS
5.0000 mg | ORAL_TABLET | ORAL | Status: DC | PRN
  Administered 2021-07-05: 5 mg via ORAL

## 2021-07-05 MED ORDER — ROCURONIUM BROMIDE 10 MG/ML (PF) SYRINGE
PREFILLED_SYRINGE | INTRAVENOUS | Status: AC
  Filled 2021-07-05: qty 10

## 2021-07-05 MED ORDER — IBUPROFEN 600 MG PO TABS: 600.0000 mg | ORAL_TABLET | Freq: Four times a day (QID) | ORAL | 1 refills | Status: AC | PRN

## 2021-07-05 MED ORDER — PHENYLEPHRINE 40 MCG/ML (10ML) SYRINGE FOR IV PUSH (FOR BLOOD PRESSURE SUPPORT)
PREFILLED_SYRINGE | INTRAVENOUS | Status: AC
  Filled 2021-07-05: qty 10

## 2021-07-05 MED ORDER — INDIGOTINDISULFONATE SODIUM 8 MG/ML IJ SOLN
INTRAMUSCULAR | Status: DC | PRN
  Administered 2021-07-05: 40 mg via INTRAVENOUS

## 2021-07-05 MED ORDER — SODIUM CHLORIDE 0.9 % IR SOLN
Status: DC | PRN
  Administered 2021-07-05 (×2): 700 mL

## 2021-07-05 MED ORDER — SODIUM CHLORIDE 0.9 % IV SOLN
INTRAVENOUS | Status: DC | PRN
  Administered 2021-07-05: 60 mL

## 2021-07-05 MED ORDER — CEFAZOLIN SODIUM-DEXTROSE 2-4 GM/100ML-% IV SOLN
INTRAVENOUS | Status: AC
  Filled 2021-07-05: qty 100

## 2021-07-05 MED ORDER — MIDAZOLAM HCL 5 MG/5ML IJ SOLN
INTRAMUSCULAR | Status: DC | PRN
  Administered 2021-07-05: 2 mg via INTRAVENOUS

## 2021-07-05 MED ORDER — FENTANYL CITRATE (PF) 100 MCG/2ML IJ SOLN
25.0000 ug | INTRAMUSCULAR | Status: DC | PRN
  Administered 2021-07-05: 25 ug via INTRAVENOUS

## 2021-07-05 MED ORDER — ROCURONIUM BROMIDE 10 MG/ML (PF) SYRINGE
PREFILLED_SYRINGE | INTRAVENOUS | Status: DC | PRN
  Administered 2021-07-05: 50 mg via INTRAVENOUS
  Administered 2021-07-05: 20 mg via INTRAVENOUS

## 2021-07-05 MED ORDER — ACETAMINOPHEN 500 MG PO TABS
ORAL_TABLET | ORAL | Status: AC
  Filled 2021-07-05: qty 2

## 2021-07-05 MED ORDER — IBUPROFEN 200 MG PO TABS: 600.0000 mg | ORAL_TABLET | Freq: Four times a day (QID) | ORAL | Status: DC

## 2021-07-05 MED ORDER — KETOROLAC TROMETHAMINE 15 MG/ML IJ SOLN: 15.0000 mg | INTRAMUSCULAR | Status: DC

## 2021-07-05 MED ORDER — GLYCOPYRROLATE PF 0.2 MG/ML IJ SOSY
PREFILLED_SYRINGE | INTRAMUSCULAR | Status: DC | PRN
  Administered 2021-07-05: .2 mg via INTRAVENOUS

## 2021-07-05 MED ORDER — KETOROLAC TROMETHAMINE 30 MG/ML IJ SOLN
30.0000 mg | Freq: Four times a day (QID) | INTRAMUSCULAR | Status: DC
  Administered 2021-07-05: 30 mg via INTRAVENOUS

## 2021-07-05 MED ORDER — CEFAZOLIN SODIUM-DEXTROSE 2-4 GM/100ML-% IV SOLN
2.0000 g | Freq: Once | INTRAVENOUS | Status: AC
  Administered 2021-07-05: 2 g via INTRAVENOUS

## 2021-07-05 MED ORDER — ONDANSETRON HCL 4 MG PO TABS: 4.0000 mg | ORAL_TABLET | Freq: Four times a day (QID) | ORAL | Status: DC | PRN

## 2021-07-05 MED ORDER — KETOROLAC TROMETHAMINE 30 MG/ML IJ SOLN
INTRAMUSCULAR | Status: DC | PRN
  Administered 2021-07-05: 30 mg via INTRAVENOUS

## 2021-07-05 MED ORDER — MIDAZOLAM HCL 2 MG/2ML IJ SOLN
INTRAMUSCULAR | Status: AC
  Filled 2021-07-05: qty 2

## 2021-07-05 MED ORDER — PHENYLEPHRINE 40 MCG/ML (10ML) SYRINGE FOR IV PUSH (FOR BLOOD PRESSURE SUPPORT)
PREFILLED_SYRINGE | INTRAVENOUS | Status: DC | PRN
  Administered 2021-07-05 (×2): 80 ug via INTRAVENOUS
  Administered 2021-07-05 (×2): 120 ug via INTRAVENOUS

## 2021-07-05 MED ORDER — SUGAMMADEX SODIUM 200 MG/2ML IV SOLN
INTRAVENOUS | Status: DC | PRN
  Administered 2021-07-05: 200 mg via INTRAVENOUS

## 2021-07-05 MED ORDER — FENTANYL CITRATE (PF) 100 MCG/2ML IJ SOLN
INTRAMUSCULAR | Status: DC | PRN
  Administered 2021-07-05: 150 ug via INTRAVENOUS

## 2021-07-05 MED ORDER — KETOROLAC TROMETHAMINE 30 MG/ML IJ SOLN
INTRAMUSCULAR | Status: AC
  Filled 2021-07-05: qty 1

## 2021-07-05 MED ORDER — DEXAMETHASONE SODIUM PHOSPHATE 10 MG/ML IJ SOLN
INTRAMUSCULAR | Status: DC | PRN
  Administered 2021-07-05: 10 mg via INTRAVENOUS

## 2021-07-05 MED ORDER — LACTATED RINGERS IV SOLN: INTRAVENOUS | Status: DC

## 2021-07-05 MED ORDER — PROPOFOL 10 MG/ML IV BOLUS
INTRAVENOUS | Status: AC
  Filled 2021-07-05: qty 20

## 2021-07-05 MED ORDER — OXYCODONE HCL 5 MG PO TABS
ORAL_TABLET | ORAL | Status: AC
  Filled 2021-07-05: qty 1

## 2021-07-05 MED ORDER — HYDROMORPHONE HCL 1 MG/ML IJ SOLN: 0.2000 mg | INTRAMUSCULAR | Status: DC | PRN

## 2021-07-05 MED ORDER — FENTANYL CITRATE (PF) 100 MCG/2ML IJ SOLN
INTRAMUSCULAR | Status: AC
  Filled 2021-07-05: qty 2

## 2021-07-05 MED ORDER — LIDOCAINE HCL (PF) 2 % IJ SOLN
INTRAMUSCULAR | Status: DC | PRN
  Administered 2021-07-05: 1.5 mg/kg/h via INTRADERMAL

## 2021-07-05 MED ORDER — PROPOFOL 10 MG/ML IV BOLUS
INTRAVENOUS | Status: DC | PRN
  Administered 2021-07-05: 200 mg via INTRAVENOUS

## 2021-07-05 MED ORDER — FENTANYL CITRATE (PF) 250 MCG/5ML IJ SOLN
INTRAMUSCULAR | Status: AC
  Filled 2021-07-05: qty 5

## 2021-07-05 MED ORDER — ACETAMINOPHEN 500 MG PO TABS
1000.0000 mg | ORAL_TABLET | ORAL | Status: AC
  Administered 2021-07-05: 1000 mg via ORAL

## 2021-07-05 MED ORDER — ONDANSETRON HCL 4 MG/2ML IJ SOLN
INTRAMUSCULAR | Status: DC | PRN
  Administered 2021-07-05: 4 mg via INTRAVENOUS

## 2021-07-05 MED ORDER — EPHEDRINE 5 MG/ML INJ
INTRAVENOUS | Status: AC
  Filled 2021-07-05: qty 5

## 2021-07-05 MED ORDER — EPHEDRINE SULFATE-NACL 50-0.9 MG/10ML-% IV SOSY
PREFILLED_SYRINGE | INTRAVENOUS | Status: DC | PRN
  Administered 2021-07-05: 5 mg via INTRAVENOUS

## 2021-07-05 MED ORDER — DEXAMETHASONE SODIUM PHOSPHATE 10 MG/ML IJ SOLN
INTRAMUSCULAR | Status: AC
  Filled 2021-07-05: qty 1

## 2021-07-05 SURGICAL SUPPLY — 59 items
ADH SKN CLS APL DERMABOND .7 (GAUZE/BANDAGES/DRESSINGS) ×2
BARRIER ADHS 3X4 INTERCEED (GAUZE/BANDAGES/DRESSINGS) IMPLANT
BRR ADH 4X3 ABS CNTRL BYND (GAUZE/BANDAGES/DRESSINGS)
COVER BACK TABLE 60X90IN (DRAPES) ×4 IMPLANT
COVER TIP SHEARS 8 DVNC (MISCELLANEOUS) ×2 IMPLANT
COVER TIP SHEARS 8MM DA VINCI (MISCELLANEOUS) ×4
DECANTER SPIKE VIAL GLASS SM (MISCELLANEOUS) ×2 IMPLANT
DEFOGGER SCOPE WARMER CLEARIFY (MISCELLANEOUS) ×4 IMPLANT
DERMABOND ADVANCED (GAUZE/BANDAGES/DRESSINGS) ×2
DERMABOND ADVANCED .7 DNX12 (GAUZE/BANDAGES/DRESSINGS) ×2 IMPLANT
DRAPE ARM DVNC X/XI (DISPOSABLE) ×8 IMPLANT
DRAPE COLUMN DVNC XI (DISPOSABLE) ×2 IMPLANT
DRAPE DA VINCI XI ARM (DISPOSABLE) ×16
DRAPE DA VINCI XI COLUMN (DISPOSABLE) ×4
DRAPE UTILITY XL STRL (DRAPES) ×4 IMPLANT
DURAPREP 26ML APPLICATOR (WOUND CARE) ×4 IMPLANT
ELECT REM PT RETURN 9FT ADLT (ELECTROSURGICAL) ×4
ELECTRODE REM PT RTRN 9FT ADLT (ELECTROSURGICAL) ×2 IMPLANT
GAUZE 4X4 16PLY ~~LOC~~+RFID DBL (SPONGE) ×8 IMPLANT
GLOVE SURG ENC MOIS LTX SZ6 (GLOVE) ×12 IMPLANT
GLOVE SURG UNDER POLY LF SZ6.5 (GLOVE) ×12 IMPLANT
HOLDER FOLEY CATH W/STRAP (MISCELLANEOUS) IMPLANT
IRRIG SUCT STRYKERFLOW 2 WTIP (MISCELLANEOUS) ×4
IRRIGATION SUCT STRKRFLW 2 WTP (MISCELLANEOUS) ×2 IMPLANT
IV NS 1000ML (IV SOLUTION) ×8
IV NS 1000ML BAXH (IV SOLUTION) IMPLANT
KIT TURNOVER CYSTO (KITS) ×4 IMPLANT
LEGGING LITHOTOMY PAIR STRL (DRAPES) ×4 IMPLANT
MANIPULATOR ADVINCU DEL 2.5 PL (MISCELLANEOUS) IMPLANT
MANIPULATOR ADVINCU DEL 3.0 PL (MISCELLANEOUS) IMPLANT
MANIPULATOR ADVINCU DEL 3.5 PL (MISCELLANEOUS) ×2 IMPLANT
MANIPULATOR ADVINCU DEL 4.0 PL (MISCELLANEOUS) IMPLANT
NEEDLE INSUFFLATION 120MM (ENDOMECHANICALS) ×4 IMPLANT
NS IRRIG 500ML POUR BTL (IV SOLUTION) ×2 IMPLANT
OBTURATOR OPTICAL STANDARD 8MM (TROCAR) ×4
OBTURATOR OPTICAL STND 8 DVNC (TROCAR) ×2
OBTURATOR OPTICALSTD 8 DVNC (TROCAR) ×2 IMPLANT
PACK ROBOT WH (CUSTOM PROCEDURE TRAY) ×4 IMPLANT
PACK ROBOTIC GOWN (GOWN DISPOSABLE) ×4 IMPLANT
PACK TRENDGUARD 450 HYBRID PRO (MISCELLANEOUS) IMPLANT
PAD OB MATERNITY 4.3X12.25 (PERSONAL CARE ITEMS) ×4 IMPLANT
PAD PREP 24X48 CUFFED NSTRL (MISCELLANEOUS) ×4 IMPLANT
POUCH LAPAROSCOPIC INSTRUMENT (MISCELLANEOUS) IMPLANT
PROTECTOR NERVE ULNAR (MISCELLANEOUS) ×4 IMPLANT
SEAL CANN UNIV 5-8 DVNC XI (MISCELLANEOUS) ×6 IMPLANT
SEAL XI 5MM-8MM UNIVERSAL (MISCELLANEOUS) ×12
SET IRRIG Y TYPE TUR BLADDER L (SET/KITS/TRAYS/PACK) ×4 IMPLANT
SET TRI-LUMEN FLTR TB AIRSEAL (TUBING) ×4 IMPLANT
SPONGE T-LAP 4X18 ~~LOC~~+RFID (SPONGE) ×2 IMPLANT
SUT VIC AB 0 CT1 36 (SUTURE) ×4 IMPLANT
SUT VICRYL RAPIDE 3 0 (SUTURE) ×8 IMPLANT
SUT VLOC 180 0 9IN  GS21 (SUTURE) ×4
SUT VLOC 180 0 9IN GS21 (SUTURE) ×2 IMPLANT
TOWEL OR 17X26 10 PK STRL BLUE (TOWEL DISPOSABLE) ×4 IMPLANT
TRAY FOLEY W/BAG SLVR 14FR LF (SET/KITS/TRAYS/PACK) ×4 IMPLANT
TRENDGUARD 450 HYBRID PRO PACK (MISCELLANEOUS) ×4
TROCAR BLADELESS OPT 5 100 (ENDOMECHANICALS) IMPLANT
TROCAR PORT AIRSEAL 5X120 (TROCAR) ×4 IMPLANT
WATER STERILE IRR 1000ML POUR (IV SOLUTION) ×2 IMPLANT

## 2021-07-05 NOTE — Brief Op Note (Signed)
07/05/2021  12:43 PM  PATIENT:  Dawn Pitts  45 y.o. female  PRE-OPERATIVE DIAGNOSIS:  symptomatic multifibroid uterus, menorrhagia, pelvic pressure  POST-OPERATIVE DIAGNOSIS:  symptomatic multifibroid uterus, menorrhagia, pelvic pressure  PROCEDURE:  Procedure(s): XI ROBOTIC ASSISTED TOTAL HYSTERECTOMY WITH BILATERAL SALPINGECTOMY (Bilateral) CYSTOSCOPY (N/A)  SURGEON:  Surgeon(s) and Role:    * Rowland Lathe, MD - Primary    * Bobbye Charleston, MD - Assisting   ANESTHESIA:   local and general  EBL:  20 mL   BLOOD ADMINISTERED:none  DRAINS: Urinary Catheter (Foley)   LOCAL MEDICATIONS USED:  OTHER Ropivicaine  SPECIMEN:  Source of Specimen:  uterus, cervix, bilateral fallopian tubes  DISPOSITION OF SPECIMEN:  PATHOLOGY  COUNTS:  YES  TOURNIQUET:  * No tourniquets in log *  DICTATION: .Note written in EPIC  PLAN OF CARE: Discharge to home after PACU  PATIENT DISPOSITION:  PACU - hemodynamically stable.   Delay start of Pharmacological VTE agent (>24hrs) due to surgical blood loss or risk of bleeding: not applicable  M. Brien Mates, MD 07/06/2211:44 PM

## 2021-07-05 NOTE — Anesthesia Preprocedure Evaluation (Addendum)
Anesthesia Evaluation  Patient identified by MRN, date of birth, ID band Patient awake    Reviewed: Allergy & Precautions, NPO status , Patient's Chart, lab work & pertinent test results  Airway Mallampati: II  TM Distance: >3 FB     Dental   Pulmonary neg pulmonary ROS,    breath sounds clear to auscultation       Cardiovascular hypertension,  Rhythm:Regular Rate:Normal     Neuro/Psych  Headaches,    GI/Hepatic negative GI ROS, Neg liver ROS,   Endo/Other  negative endocrine ROS  Renal/GU negative Renal ROS     Musculoskeletal   Abdominal   Peds  Hematology   Anesthesia Other Findings   Reproductive/Obstetrics                             Anesthesia Physical Anesthesia Plan  ASA: 3  Anesthesia Plan: General   Post-op Pain Management:    Induction: Intravenous  PONV Risk Score and Plan: 3 and Ondansetron, Dexamethasone and Midazolam  Airway Management Planned: Oral ETT  Additional Equipment:   Intra-op Plan:   Post-operative Plan: Extubation in OR  Informed Consent: I have reviewed the patients History and Physical, chart, labs and discussed the procedure including the risks, benefits and alternatives for the proposed anesthesia with the patient or authorized representative who has indicated his/her understanding and acceptance.     Dental advisory given  Plan Discussed with: CRNA and Anesthesiologist  Anesthesia Plan Comments:         Anesthesia Quick Evaluation

## 2021-07-05 NOTE — Op Note (Signed)
07/05/2021   12:43 PM   PATIENT:  Dawn Pitts  45 y.o. female   PRE-OPERATIVE DIAGNOSIS:  symptomatic multifibroid uterus, menorrhagia, pelvic pressure   POST-OPERATIVE DIAGNOSIS:  symptomatic multifibroid uterus, menorrhagia, pelvic pressure   PROCEDURE:  Procedure(s): XI ROBOTIC ASSISTED TOTAL HYSTERECTOMY WITH BILATERAL SALPINGECTOMY (Bilateral) CYSTOSCOPY (N/A)   SURGEON:  Surgeon(s) and Role:    * Rowland Lathe, MD - Primary    * Bobbye Charleston, MD - Assisting     ANESTHESIA:   local and general   EBL:  20 mL    BLOOD ADMINISTERED:none   DRAINS: Urinary Catheter (Foley)    LOCAL MEDICATIONS USED:  OTHER Ropivicaine   SPECIMEN:  Source of Specimen:  uterus, cervix, bilateral fallopian tubes   DISPOSITION OF SPECIMEN:  PATHOLOGY   COUNTS:  YES   PLAN OF CARE: Discharge to home after PACU   PATIENT DISPOSITION:  PACU - hemodynamically stable.  FINDINGS:  On bimanual exam, 10 week size multifibroid uterus sounded to 11cm.   On laparoscopic view, multifibroid appearing uterus with multiple 2-4 cm fibroids. Bilateral fallopian tubes normal appearing. Normal appearing ovaries bilaterally with 1cm simple cyst on right ovary. Normal appearing ureters bilaterally. Cystoscopy demonstrated intact bladder and bilateral ureteral jets.    DESCRIPTION OF PROCEDURE: After consent was verified the patient was taken to the operating room.  After adequate anesthesia was achieved the patient was positioned in the dorsal lithotomy position with legs in stirrups. SCDs were in place and cycling. The patient was prepped and draped in normal sterile fashion. Ancef 2g was given for infection prophylaxis. A speculum was placed in the vagina and the anterior lip of the cervix was grasped with a tenaculum. A stay suture was placed on the anterior lip of the cervix. The uterus was sounded to 11cm and the cervix dilated with [facility] dilators.  The 3.5 cm delineating ring was assembled  and placed in the proper fashion.  The speculum was removed and the bladder was drained with a Foley catheter.   Attention was turned to the abdomen. An 32m incision was made 1 cm above the umbilicus and the Veress needle was inserted through the incision. Correct placement was confirmed by opening pressure of 522mg. The abdomen was insufflated with CO2 gas to a total pressure of 1570m. The 8 mm robotic trochar was inserted into the abdominal cavity under direct laparoscopic visualization. The laparoscope was inserted and confirmed no vascular or visceral trauma from entry. The patient was placed in steep Trendelenburg.  Two additional 8mm17mbotic trochars were placed, one on each side of the abdomen. An 5mm 12mseal assistant port was placed on the left side. All trochars were inserted under direct visualization of the camera.  The robot was docked.  The fenestrated bipolar was placed on arm 1 and the monopolar scissors on arm 3 and introduced under direct visualization of the camera.   Survey of the abdomen revealed findings as noted above. Bilateral  ureters were visualized peristalsing. The left mesosalpinx was serially ligated to free the left fallopian tube which was removed and sent for pathology. The same was repeated on the right side. The left round ligament was transected.  The vesicouterine peritoneum was opened and the bladder was dissected off the lower uterine segment and upper vagina.  The same was repeated on the right side. The left posterior broad ligament was skeletonized. The same process was repeated on the right side. The left uterine vessels were dessicated with bipolar cautery  and transected with monopolar scissors. The right uterine vessels were transected in the same fashion. The cervicovaginal junction was identified with the delineating ring and the monopolar scissors were used to enter the vagina and incise circumferentially around the cervicovaginal junction. The uterus and cervix  were removed through the vagina and sent for pathology. The vaginal cuff was closed with V loc suture in a running fashion. The suture was removed. An assistant palpated the vaginal cuff from the vaginal field and confirmed the vaginal cuff was intact without defect. The abdomen was irrigated and suctioned. The pelvis was observed to be hemostatic at 16mHg and 862mg. The robot was undocked and Ropivicaine was introduced into the pelvis. All ports were removed. Indigo carmine was administered.    The port sites were closed with 3-0 vicryl in a subcuticular fashion and covered with skin glue.  The foley catheter was removed. A cystoscope was inserted into the bladder and the bladder was distended with saline. Survey revealed intact bladder and bilateral ureteral jets were appreciated. The bladder was drained and foley reintroduced. A speculum was inserted into the vagina and vaginal cuff appeared intact and hemostatic. A manual exam again confirmed the vaginal cuff was intact without defect.    The patient was awakened from anesthesia and taken to the recovery room in stable condition.  M.Luther RedoMD 07/05/21 12:52 PM

## 2021-07-05 NOTE — Anesthesia Procedure Notes (Signed)
Procedure Name: Intubation Date/Time: 07/05/2021 11:04 AM Performed by: Rogers Blocker, CRNA Pre-anesthesia Checklist: Patient identified, Emergency Drugs available, Suction available and Patient being monitored Patient Re-evaluated:Patient Re-evaluated prior to induction Oxygen Delivery Method: Circle System Utilized Preoxygenation: Pre-oxygenation with 100% oxygen Induction Type: IV induction Ventilation: Mask ventilation without difficulty Laryngoscope Size: Mac and 3 Grade View: Grade I Tube type: Oral Tube size: 7.0 mm Number of attempts: 1 Airway Equipment and Method: Stylet Placement Confirmation: ETT inserted through vocal cords under direct vision, positive ETCO2 and breath sounds checked- equal and bilateral Secured at: 22 cm Tube secured with: Tape Dental Injury: Teeth and Oropharynx as per pre-operative assessment

## 2021-07-05 NOTE — Transfer of Care (Signed)
Immediate Anesthesia Transfer of Care Note  Patient: Dawn Pitts  Procedure(s) Performed: XI ROBOTIC ASSISTED TOTAL HYSTERECTOMY WITH BILATERAL SALPINGECTOMY (Bilateral: Abdomen) CYSTOSCOPY (Bladder)  Patient Location: PACU  Anesthesia Type:General  Level of Consciousness: drowsy, patient cooperative and responds to stimulation  Airway & Oxygen Therapy: Patient Spontanous Breathing and Patient connected to face mask oxygen  Post-op Assessment: Report given to RN and Post -op Vital signs reviewed and stable  Post vital signs: Reviewed and stable  Last Vitals:  Vitals Value Taken Time  BP 154/91 07/05/21 1250  Temp    Pulse    Resp 17 07/05/21 1256  SpO2    Vitals shown include unvalidated device data.  Last Pain:  Vitals:   07/05/21 0927  TempSrc: Oral  PainSc: 5       Patients Stated Pain Goal: 9 ([id number])  Complications: No notable events documented.

## 2021-07-05 NOTE — Anesthesia Postprocedure Evaluation (Signed)
Anesthesia Post Note  Patient: Dawn Pitts  Procedure(s) Performed: XI ROBOTIC ASSISTED TOTAL HYSTERECTOMY WITH BILATERAL SALPINGECTOMY (Bilateral: Abdomen) CYSTOSCOPY (Bladder)     Patient location during evaluation: PACU Anesthesia Type: General Level of consciousness: awake Pain management: pain level controlled Vital Signs Assessment: post-procedure vital signs reviewed and stable Respiratory status: spontaneous breathing Cardiovascular status: stable Postop Assessment: no apparent nausea or vomiting Anesthetic complications: no   No notable events documented.  Last Vitals:  Vitals:   07/05/21 1340 07/05/21 1358  BP: (!) 126/104 (!) 152/93  Pulse: 88 74  Resp: 15 16  Temp: 36.4 C   SpO2: 100% 100%    Last Pain:  Vitals:   07/05/21 1340  TempSrc:   PainSc: 6                  Rhiley Tarver

## 2021-07-06 ENCOUNTER — Encounter (HOSPITAL_BASED_OUTPATIENT_CLINIC_OR_DEPARTMENT_OTHER): Payer: Self-pay | Admitting: Obstetrics and Gynecology

## 2021-07-06 LAB — SURGICAL PATHOLOGY

## 2022-05-02 ENCOUNTER — Other Ambulatory Visit: Payer: Self-pay | Admitting: Obstetrics and Gynecology

## 2022-05-02 DIAGNOSIS — R928 Other abnormal and inconclusive findings on diagnostic imaging of breast: Secondary | ICD-10-CM

## 2022-05-09 ENCOUNTER — Ambulatory Visit
Admission: RE | Admit: 2022-05-09 | Discharge: 2022-05-09 | Disposition: A | Discharge status: Home / Self Care | Payer: No Typology Code available for payment source | Source: Ambulatory Visit | Attending: Obstetrics and Gynecology | Admitting: Obstetrics and Gynecology

## 2022-05-09 ENCOUNTER — Ambulatory Visit
Admission: RE | Admit: 2022-05-09 | Discharge: 2022-05-09 | Disposition: A | Discharge status: Home / Self Care | Payer: BLUE CROSS/BLUE SHIELD | Source: Ambulatory Visit | Attending: Obstetrics and Gynecology | Admitting: Obstetrics and Gynecology

## 2022-05-09 ENCOUNTER — Other Ambulatory Visit: Payer: Self-pay | Admitting: Obstetrics and Gynecology

## 2022-05-09 DIAGNOSIS — R928 Other abnormal and inconclusive findings on diagnostic imaging of breast: Secondary | ICD-10-CM

## 2022-05-09 DIAGNOSIS — R599 Enlarged lymph nodes, unspecified: Secondary | ICD-10-CM

## 2022-05-17 ENCOUNTER — Other Ambulatory Visit (HOSPITAL_COMMUNITY)
Admission: RE | Admit: 2022-05-17 | Discharge: 2022-05-17 | Disposition: A | Discharge status: Home / Self Care | Payer: BLUE CROSS/BLUE SHIELD | Source: Ambulatory Visit | Attending: General Practice | Admitting: General Practice

## 2022-05-17 ENCOUNTER — Ambulatory Visit
Admission: RE | Admit: 2022-05-17 | Discharge: 2022-05-17 | Disposition: A | Discharge status: Home / Self Care | Payer: BLUE CROSS/BLUE SHIELD | Source: Ambulatory Visit | Attending: Obstetrics and Gynecology | Admitting: Obstetrics and Gynecology

## 2022-05-17 DIAGNOSIS — R599 Enlarged lymph nodes, unspecified: Secondary | ICD-10-CM | POA: Diagnosis present

## 2022-05-18 LAB — SURGICAL PATHOLOGY
